# Patient Record
Sex: Female | Born: 1950 | Race: White | Hispanic: No | Marital: Married | Smoking: Light tobacco smoker
Health system: Southern US, Community
[De-identification: ages and names within clinical notes are randomized; demographics above are authoritative.]

## PROBLEM LIST (undated history)

## (undated) DIAGNOSIS — R519 Headache, unspecified: Secondary | ICD-10-CM

## (undated) DIAGNOSIS — R51 Headache: Secondary | ICD-10-CM

## (undated) DIAGNOSIS — K644 Residual hemorrhoidal skin tags: Secondary | ICD-10-CM

## (undated) DIAGNOSIS — K219 Gastro-esophageal reflux disease without esophagitis: Secondary | ICD-10-CM

## (undated) DIAGNOSIS — Q782 Osteopetrosis: Secondary | ICD-10-CM

## (undated) HISTORY — DX: Osteopetrosis: Q78.2

## (undated) HISTORY — PX: COLONOSCOPY: SHX174

## (undated) HISTORY — PX: TUBAL LIGATION: SHX77

---
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
## 1998-07-05 HISTORY — PX: BACK SURGERY: SHX140

## 2014-05-15 ENCOUNTER — Ambulatory Visit: Payer: Self-pay | Admitting: Nurse Practitioner

## 2015-06-16 ENCOUNTER — Telehealth: Payer: Self-pay

## 2015-06-16 ENCOUNTER — Other Ambulatory Visit: Payer: Self-pay

## 2015-06-16 NOTE — Telephone Encounter (Signed)
Gastroenterology Pre-Procedure Review  Request Date:  Requesting Physician: Lilian KapurErin Huprich, NP  PATIENT REVIEW QUESTIONS: The patient responded to the following health history questions as indicated:    1. Are you having any GI issues? no 2. Do you have a personal history of Polyps? yes (5 years ago - polyps - adenoma) 3. Do you have a family history of Colon Cancer or Polyps? yes (father, colon cancer) 4. Diabetes Mellitus? no 5. Joint replacements in the past 12 months?no 6. Major health problems in the past 3 months?no 7. Any artificial heart valves, MVP, or defibrillator?no    MEDICATIONS & ALLERGIES:    Patient reports the following regarding taking any anticoagulation/antiplatelet therapy:   Plavix, Coumadin, Eliquis, Xarelto, Lovenox, Pradaxa, Brilinta, or Effient? no Aspirin? no  Patient confirms/reports the following medications:  No current outpatient prescriptions on file.   No current facility-administered medications for this visit.    Patient confirms/reports the following allergies:  Allergies not on file  No orders of the defined types were placed in this encounter.    AUTHORIZATION INFORMATION Primary Insurance: 1D#: Group #:  Secondary Insurance: 1D#: Group #:  SCHEDULE INFORMATION: Date: 07/11/15 Time: Location: [facility]

## 2015-07-03 ENCOUNTER — Encounter: Payer: Self-pay | Admitting: *Deleted

## 2015-07-10 NOTE — Discharge Instructions (Signed)

## 2015-07-11 ENCOUNTER — Encounter
Admission: RE | Disposition: A | Discharge status: Home / Self Care | Payer: Self-pay | Source: Ambulatory Visit | Attending: Gastroenterology

## 2015-07-11 ENCOUNTER — Ambulatory Visit: Payer: 59 | Admitting: Anesthesiology

## 2015-07-11 ENCOUNTER — Ambulatory Visit
Admission: RE | Admit: 2015-07-11 | Discharge: 2015-07-11 | Disposition: A | Discharge status: Home / Self Care | Payer: 59 | Source: Ambulatory Visit | Attending: Gastroenterology | Admitting: Gastroenterology

## 2015-07-11 ENCOUNTER — Encounter: Payer: Self-pay | Admitting: *Deleted

## 2015-07-11 DIAGNOSIS — K219 Gastro-esophageal reflux disease without esophagitis: Secondary | ICD-10-CM | POA: Insufficient documentation

## 2015-07-11 DIAGNOSIS — F17210 Nicotine dependence, cigarettes, uncomplicated: Secondary | ICD-10-CM | POA: Diagnosis not present

## 2015-07-11 DIAGNOSIS — Z860100 Personal history of colon polyps, unspecified: Secondary | ICD-10-CM | POA: Insufficient documentation

## 2015-07-11 DIAGNOSIS — R12 Heartburn: Secondary | ICD-10-CM | POA: Insufficient documentation

## 2015-07-11 DIAGNOSIS — K641 Second degree hemorrhoids: Secondary | ICD-10-CM | POA: Diagnosis not present

## 2015-07-11 HISTORY — DX: Residual hemorrhoidal skin tags: K64.4

## 2015-07-11 HISTORY — PX: COLONOSCOPY WITH PROPOFOL: SHX5780

## 2015-07-11 HISTORY — PX: ESOPHAGOGASTRODUODENOSCOPY (EGD) WITH PROPOFOL: SHX5813

## 2015-07-11 HISTORY — DX: Headache, unspecified: R51.9

## 2015-07-11 HISTORY — DX: Headache: R51

## 2015-07-11 HISTORY — DX: Gastro-esophageal reflux disease without esophagitis: K21.9

## 2015-07-11 SURGERY — COLONOSCOPY WITH PROPOFOL
Anesthesia: Choice

## 2015-07-11 SURGERY — COLONOSCOPY WITH PROPOFOL
Anesthesia: Monitor Anesthesia Care | Wound class: Contaminated

## 2015-07-11 MED ORDER — LIDOCAINE HCL (CARDIAC) 20 MG/ML IV SOLN
INTRAVENOUS | Status: DC | PRN
  Administered 2015-07-11: 50 mg via INTRAVENOUS

## 2015-07-11 MED ORDER — STERILE WATER FOR IRRIGATION IR SOLN
Status: DC | PRN
  Administered 2015-07-11: 09:00:00

## 2015-07-11 MED ORDER — LACTATED RINGERS IV SOLN
INTRAVENOUS | Status: DC
  Administered 2015-07-11: 08:00:00 via INTRAVENOUS

## 2015-07-11 MED ORDER — GLYCOPYRROLATE 0.2 MG/ML IJ SOLN
INTRAMUSCULAR | Status: DC | PRN
  Administered 2015-07-11: 0.1 mg via INTRAVENOUS

## 2015-07-11 MED ORDER — LACTATED RINGERS IV SOLN: 500.0000 mL | INTRAVENOUS | Status: DC

## 2015-07-11 MED ORDER — PROPOFOL 10 MG/ML IV BOLUS
INTRAVENOUS | Status: DC | PRN
  Administered 2015-07-11 (×2): 20 mg via INTRAVENOUS
  Administered 2015-07-11: 10 mg via INTRAVENOUS
  Administered 2015-07-11: 70 mg via INTRAVENOUS
  Administered 2015-07-11 (×3): 30 mg via INTRAVENOUS
  Administered 2015-07-11: 20 mg via INTRAVENOUS

## 2015-07-11 SURGICAL SUPPLY — 39 items
BALLN DILATOR 10-12 8 (BALLOONS)
BALLN DILATOR 12-15 8 (BALLOONS)
BALLN DILATOR 15-18 8 (BALLOONS)
BALLN DILATOR CRE 0-12 8 (BALLOONS)
BALLN DILATOR ESOPH 8 10 CRE (MISCELLANEOUS) IMPLANT
BALLOON DILATOR 12-15 8 (BALLOONS) IMPLANT
BALLOON DILATOR 15-18 8 (BALLOONS) IMPLANT
BALLOON DILATOR CRE 0-12 8 (BALLOONS) IMPLANT
BLOCK BITE 60FR ADLT L/F GRN (MISCELLANEOUS) ×2 IMPLANT
CANISTER SUCT 1200ML W/VALVE (MISCELLANEOUS) ×2 IMPLANT
FCP ESCP3.2XJMB 240X2.8X (MISCELLANEOUS)
FORCEPS BIOP RAD 4 LRG CAP 4 (CUTTING FORCEPS) IMPLANT
FORCEPS BIOP RJ4 240 W/NDL (MISCELLANEOUS)
FORCEPS ESCP3.2XJMB 240X2.8X (MISCELLANEOUS) IMPLANT
GOWN CVR UNV OPN BCK APRN NK (MISCELLANEOUS) ×2 IMPLANT
GOWN ISOL THUMB LOOP REG UNIV (MISCELLANEOUS) ×2
HEMOCLIP INSTINCT (CLIP) IMPLANT
INJECTOR VARIJECT VIN23 (MISCELLANEOUS) IMPLANT
KIT CO2 TUBING (TUBING) IMPLANT
KIT DEFENDO VALVE AND CONN (KITS) IMPLANT
KIT ENDO PROCEDURE OLY (KITS) ×2 IMPLANT
LIGATOR MULTIBAND 6SHOOTER MBL (MISCELLANEOUS) IMPLANT
MARKER SPOT ENDO TATTOO 5ML (MISCELLANEOUS) IMPLANT
PAD GROUND ADULT SPLIT (MISCELLANEOUS) IMPLANT
SNARE SHORT THROW 13M SML OVAL (MISCELLANEOUS) IMPLANT
SNARE SHORT THROW 30M LRG OVAL (MISCELLANEOUS) IMPLANT
SPOT EX ENDOSCOPIC TATTOO (MISCELLANEOUS)
SUCTION POLY TRAP 4CHAMBER (MISCELLANEOUS) IMPLANT
SYR INFLATION 60ML (SYRINGE) IMPLANT
TRAP SUCTION POLY (MISCELLANEOUS) IMPLANT
TUBING CONN 6MMX3.1M (TUBING)
TUBING SUCTION CONN 0.25 STRL (TUBING) IMPLANT
UNDERPAD 30X60 958B10 (PK) (MISCELLANEOUS) IMPLANT
VALVE BIOPSY ENDO (VALVE) IMPLANT
VARIJECT INJECTOR VIN23 (MISCELLANEOUS)
WATER AUXILLARY (MISCELLANEOUS) IMPLANT
WATER STERILE IRR 250ML POUR (IV SOLUTION) ×2 IMPLANT
WATER STERILE IRR 500ML POUR (IV SOLUTION) IMPLANT
WIRE CRE 18-20MM 8CM F G (MISCELLANEOUS) IMPLANT

## 2015-07-11 NOTE — Op Note (Signed)
[facility] Gastroenterology Patient Name: Karen SeeJanet Flynn Procedure Date: 07/11/2015 8:20 AM MRN: [id number] Account #: [id number] Date of Birth: 08-03-1950 Admit Type: Outpatient Age: 65 [facility] Gender: Female Note Status: Finalized Procedure:         Colonoscopy Indications:       High risk colon cancer surveillance: Personal history of                     colonic polyps Providers:         Midge Miniumarren Nasiyah Laverdiere, MD Medicines:         Propofol per Anesthesia Complications:     No immediate complications. Procedure:         Pre-Anesthesia Assessment:                    - Prior to the procedure, a History and Physical was                     performed, and patient medications and allergies were                     reviewed. The patient's tolerance of previous anesthesia                     was also reviewed. The risks and benefits of the procedure                     and the sedation options and risks were discussed with the                     patient. All questions were answered, and informed consent                     was obtained. Prior Anticoagulants: The patient has taken                     no previous anticoagulant or antiplatelet agents. ASA                     Grade Assessment: II - A patient with mild systemic                     disease. After reviewing the risks and benefits, the                     patient was deemed in satisfactory condition to undergo                     the procedure.                    After obtaining informed consent, the colonoscope was                     passed under direct vision. Throughout the procedure, the                     patient's blood pressure, pulse, and oxygen saturations                     were monitored continuously. The was introduced through                     the anus and advanced to the the cecum, identified by  appendiceal orifice and ileocecal valve. The colonoscopy      was performed without difficulty. The patient tolerated                     the procedure well. The quality of the bowel preparation                     was excellent. Findings:      The perianal and digital rectal examinations were normal.      Non-bleeding internal hemorrhoids were found during retroflexion. The       hemorrhoids were Grade II (internal hemorrhoids that prolapse but reduce       spontaneously). Impression:        - Non-bleeding internal hemorrhoids.                    - No specimens collected. Recommendation:    - Repeat colonoscopy in 5 years for surveillance. Procedure Code(s): --- Professional ---                    [phone], Colonoscopy, flexible; diagnostic, including                     collection of specimen(s) by brushing or washing, when                     performed (separate procedure) Diagnosis Code(s): --- Professional ---                    Z86.010, Personal history of colonic polyps CPT copyright 2014 American Medical Association. All rights reserved. The codes documented in this report are preliminary and upon coder review may  be revised to meet current compliance requirements. Midge Minium, MD 07/11/2015 8:49:37 AM This report has been signed electronically. Number of Addenda: 0 Note Initiated On: 07/11/2015 8:20 AM Scope Withdrawal Time: 0 hours 6 minutes 32 seconds  Total Procedure Duration: 0 hours 12 minutes 11 seconds       [facility]

## 2015-07-11 NOTE — H&P (Signed)
  [facility]  [address] [place], [place] [address] Phone: [phone] Fax : [phone]  Primary Care Physician:  Lawernce PittsHUPRICH, ERIN E, NP Primary Gastroenterologist:  Dr. Servando SnareWohl  Pre-Procedure History & Physical: HPI:  Karen SeeJanet Flynn is a 65 y.o. female is here for an endoscopy and colonoscopy.   Past Medical History  Diagnosis Date  . Anal skin tag   . Headache     sinus  . GERD (gastroesophageal reflux disease)     rare    Past Surgical History  Procedure Laterality Date  . Back surgery  2000  . Cesarean section  1985  . Colonoscopy      Prior to Admission medications   Medication Sig Start Date End Date Taking? Authorizing Provider  acetaminophen (TYLENOL) 325 MG tablet Take 650 mg by mouth every 6 (six) hours as needed.   Yes Historical Provider, MD  loratadine-pseudoephedrine (CLARITIN-D 12-HOUR) 5-120 MG tablet Take 1 tablet by mouth 2 (two) times daily as needed for allergies.   Yes Historical Provider, MD  nystatin cream (MYCOSTATIN) Apply 1 application topically 2 (two) times daily as needed for dry skin.   Yes Historical Provider, MD    Allergies as of 06/16/2015  . (No Known Allergies)    History reviewed. No pertinent family history.  Social History   Social History  . Marital Status: Married    Spouse Name: N/A  . Number of Children: N/A  . Years of Education: N/A   Occupational History  . Not on file.   Social History Main Topics  . Smoking status: Light Tobacco Smoker -- 0.25 packs/day for 40 years    Types: Cigarettes  . Smokeless tobacco: Not on file  . Alcohol Use: Yes     Comment: 1 glass wine/month  . Drug Use: Not on file  . Sexual Activity: Not on file   Other Topics Concern  . Not on file   Social History Narrative    Review of Systems: See HPI, otherwise negative ROS  Physical Exam: BP 124/90 mmHg  Pulse 79  Temp(Src) 97.9 F (36.6 C)  Resp 16  Ht 5' (1.524 m)  Wt 106 lb (48.081 kg)  BMI 20.70  kg/m2  SpO2 97% General:   Alert,  pleasant and cooperative in NAD Head:  Normocephalic and atraumatic. Neck:  Supple; no masses or thyromegaly. Lungs:  Clear throughout to auscultation.    Heart:  Regular rate and rhythm. Abdomen:  Soft, nontender and nondistended. Normal bowel sounds, without guarding, and without rebound.   Neurologic:  Alert and  oriented x4;  grossly normal neurologically.  Impression/Plan: Karen Flynn is here for an endoscopy and colonoscopy to be performed for gerd and history of polyps.  Risks, benefits, limitations, and alternatives regarding  endoscopy and colonoscopy have been reviewed with the patient.  Questions have been answered.  All parties agreeable.   Darlina Rumpfaren Braden Deloach, MD  07/11/2015, 8:18 AM

## 2015-07-11 NOTE — Anesthesia Postprocedure Evaluation (Signed)
Anesthesia Post Note  Patient: Karen Flynn  Procedure(s) Performed: Procedure(s) (LRB): COLONOSCOPY WITH PROPOFOL (N/A) ESOPHAGOGASTRODUODENOSCOPY (EGD) WITH PROPOFOL (N/A)  Patient location during evaluation: PACU Anesthesia Type: MAC Level of consciousness: awake and alert Pain management: pain level controlled Vital Signs Assessment: post-procedure vital signs reviewed and stable Respiratory status: spontaneous breathing, nonlabored ventilation, respiratory function stable and patient connected to nasal cannula oxygen Cardiovascular status: blood pressure returned to baseline and stable Postop Assessment: no signs of nausea or vomiting Anesthetic complications: no    DANIEL D KOVACS

## 2015-07-11 NOTE — Transfer of Care (Signed)
Immediate Anesthesia Transfer of Care Note  Patient: Karen Flynn  Procedure(s) Performed: Procedure(s): COLONOSCOPY WITH PROPOFOL (N/A) ESOPHAGOGASTRODUODENOSCOPY (EGD) WITH PROPOFOL (N/A)  Patient Location: PACU  Anesthesia Type: MAC  Level of Consciousness: awake, alert  and patient cooperative  Airway and Oxygen Therapy: Patient Spontanous Breathing and Patient connected to supplemental oxygen  Post-op Assessment: Post-op Vital signs reviewed, Patient's Cardiovascular Status Stable, Respiratory Function Stable, Patent Airway and No signs of Nausea or vomiting  Post-op Vital Signs: Reviewed and stable  Complications: No apparent anesthesia complications

## 2015-07-11 NOTE — Anesthesia Procedure Notes (Signed)
Procedure Name: MAC Performed by: Taksh Hjort Pre-anesthesia Checklist: Patient identified, Emergency Drugs available, Suction available, Timeout performed and Patient being monitored Patient Re-evaluated:Patient Re-evaluated prior to inductionOxygen Delivery Method: Nasal cannula Placement Confirmation: positive ETCO2       

## 2015-07-11 NOTE — Anesthesia Preprocedure Evaluation (Signed)
Anesthesia Evaluation  Patient identified by MRN, date of birth, ID band Patient awake    Reviewed: Allergy & Precautions, H&P , NPO status , Patient's Chart, lab work & pertinent test results, reviewed documented beta blocker date and time   Airway Mallampati: II  TM Distance: >3 FB Neck ROM: full    Dental no notable dental hx.    Pulmonary Current Smoker,    Pulmonary exam normal breath sounds clear to auscultation       Cardiovascular Exercise Tolerance: Good negative cardio ROS   Rhythm:regular Rate:Normal     Neuro/Psych negative neurological ROS  negative psych ROS   GI/Hepatic Neg liver ROS, GERD  Medicated and Controlled,  Endo/Other  negative endocrine ROS  Renal/GU negative Renal ROS  negative genitourinary   Musculoskeletal   Abdominal   Peds  Hematology negative hematology ROS (+)   Anesthesia Other Findings   Reproductive/Obstetrics negative OB ROS                             Anesthesia Physical Anesthesia Plan  ASA: II  Anesthesia Plan: MAC   Post-op Pain Management:    Induction:   Airway Management Planned:   Additional Equipment:   Intra-op Plan:   Post-operative Plan:   Informed Consent: I have reviewed the patients History and Physical, chart, labs and discussed the procedure including the risks, benefits and alternatives for the proposed anesthesia with the patient or authorized representative who has indicated his/her understanding and acceptance.     Plan Discussed with: CRNA  Anesthesia Plan Comments:         Anesthesia Quick Evaluation

## 2015-07-11 NOTE — Op Note (Signed)
[facility] Gastroenterology Patient Name: Karen Flynn Procedure Date: 07/11/2015 8:20 AM MRN: [id number] Account #: [id number] Date of Birth: 1951-05-16 Admit Type: Outpatient Age: 65 Room: [facility] OR ROOM 01 Gender: Female Note Status: Finalized Procedure:         Upper GI endoscopy Indications:       Heartburn Providers:         Karen Minium, MD Medicines:         Propofol per Anesthesia Complications:     No immediate complications. Procedure:         Pre-Anesthesia Assessment:                    - Prior to the procedure, a History and Physical was                     performed, and patient medications and allergies were                     reviewed. The patient's tolerance of previous anesthesia                     was also reviewed. The risks and benefits of the procedure                     and the sedation options and risks were discussed with the                     patient. All questions were answered, and informed consent                     was obtained. Prior Anticoagulants: The patient has taken                     no previous anticoagulant or antiplatelet agents. ASA                     Grade Assessment: II - A patient with mild systemic                     disease. After reviewing the risks and benefits, the                     patient was deemed in satisfactory condition to undergo                     the procedure.                    After obtaining informed consent, the endoscope was passed                     under direct vision. Throughout the procedure, the                     patient's blood pressure, pulse, and oxygen saturations                     were monitored continuously. The Olympus GIF-[id number]                     Endoscope (S#. [phone]) was introduced through the mouth,                     and advanced to the second part of duodenum. The upper GI  endoscopy was accomplished without difficulty. The patient        tolerated the procedure well. Findings:      The examined esophagus was normal.      The stomach was normal.      The examined duodenum was normal. Impression:        - Normal esophagus.                    - Normal stomach.                    - Normal examined duodenum.                    - No specimens collected. Recommendation:    - Discharge patient to home. Procedure Code(s): --- Professional ---                    [phone], Esophagogastroduodenoscopy, flexible, transoral;                     diagnostic, including collection of specimen(s) by                     brushing or washing, when performed (separate procedure) Diagnosis Code(s): --- Professional ---                    R12, Heartburn CPT copyright 2014 American Medical Association. All rights reserved. The codes documented in this report are preliminary and upon coder review may  be revised to meet current compliance requirements. Karen Miniumarren Serrina Minogue, MD 07/11/2015 8:35:26 AM This report has been signed electronically. Number of Addenda: 0 Note Initiated On: 07/11/2015 8:20 AM Total Procedure Duration: 0 hours 2 minutes 8 seconds       [facility]

## 2015-07-12 ENCOUNTER — Encounter: Payer: Self-pay | Admitting: Gastroenterology

## 2015-11-11 DIAGNOSIS — K219 Gastro-esophageal reflux disease without esophagitis: Secondary | ICD-10-CM | POA: Insufficient documentation

## 2015-11-11 DIAGNOSIS — G44229 Chronic tension-type headache, not intractable: Secondary | ICD-10-CM | POA: Insufficient documentation

## 2015-11-11 DIAGNOSIS — H9319 Tinnitus, unspecified ear: Secondary | ICD-10-CM | POA: Insufficient documentation

## 2015-11-11 DIAGNOSIS — M8580 Other specified disorders of bone density and structure, unspecified site: Secondary | ICD-10-CM | POA: Insufficient documentation

## 2015-11-11 DIAGNOSIS — M5030 Other cervical disc degeneration, unspecified cervical region: Secondary | ICD-10-CM | POA: Insufficient documentation

## 2016-04-09 DIAGNOSIS — M778 Other enthesopathies, not elsewhere classified: Secondary | ICD-10-CM | POA: Insufficient documentation

## 2016-06-03 ENCOUNTER — Other Ambulatory Visit: Payer: Self-pay | Admitting: Otolaryngology

## 2016-06-03 DIAGNOSIS — H903 Sensorineural hearing loss, bilateral: Secondary | ICD-10-CM

## 2016-06-03 DIAGNOSIS — R519 Headache, unspecified: Secondary | ICD-10-CM

## 2016-06-03 DIAGNOSIS — R51 Headache: Secondary | ICD-10-CM

## 2016-06-03 DIAGNOSIS — H9313 Tinnitus, bilateral: Secondary | ICD-10-CM

## 2016-06-15 ENCOUNTER — Ambulatory Visit
Admission: RE | Admit: 2016-06-15 | Discharge: 2016-06-15 | Disposition: A | Discharge status: Home / Self Care | Payer: 59 | Source: Ambulatory Visit | Attending: Otolaryngology | Admitting: Otolaryngology

## 2016-06-15 DIAGNOSIS — R519 Headache, unspecified: Secondary | ICD-10-CM

## 2016-06-15 DIAGNOSIS — H903 Sensorineural hearing loss, bilateral: Secondary | ICD-10-CM | POA: Insufficient documentation

## 2016-06-15 DIAGNOSIS — R51 Headache: Secondary | ICD-10-CM | POA: Diagnosis present

## 2016-06-15 DIAGNOSIS — M2548 Effusion, other site: Secondary | ICD-10-CM | POA: Diagnosis not present

## 2016-06-15 DIAGNOSIS — H9313 Tinnitus, bilateral: Secondary | ICD-10-CM

## 2016-06-15 MED ORDER — GADOBENATE DIMEGLUMINE 529 MG/ML IV SOLN
10.0000 mL | Freq: Once | INTRAVENOUS | Status: AC | PRN
  Administered 2016-06-15: 9 mL via INTRAVENOUS

## 2016-07-29 ENCOUNTER — Ambulatory Visit: Payer: 59 | Admitting: Dietician

## 2016-07-30 ENCOUNTER — Encounter: Payer: 59 | Attending: Internal Medicine | Admitting: Dietician

## 2016-07-30 VITALS — Ht 60.0 in | Wt 101.0 lb

## 2016-07-30 DIAGNOSIS — Z713 Dietary counseling and surveillance: Secondary | ICD-10-CM | POA: Diagnosis not present

## 2016-07-30 DIAGNOSIS — M810 Age-related osteoporosis without current pathological fracture: Secondary | ICD-10-CM

## 2016-07-30 NOTE — Patient Instructions (Addendum)
-  Continue to eat small, frequent meals throughout the day  -Try to include a protein and calcium/ vitamin D source with each meal or snack  -Include more protein sources throughout the day (can be protein alternatives such as soy, beans, dairy, peanut butter)   -Continue to take calcium supplement  -Include a fiber, healthy fat or protein source with snacks like chocolate to decrease glycemic load

## 2016-07-30 NOTE — Progress Notes (Signed)
Medical Nutrition Therapy: Visit start time: 1330  end time: 1430  Assessment:  Diagnosis: Osteoporosis Past medical history: Osteopenia, Tinnitus, GERD, Degenerative Disk Disease Psychosocial issues/ stress concerns: none Preferred learning method:  . No preference indicated  Current weight: 101.1lbs  Height: 5\' 0"  Medications, supplements: stopped taking all prescription medications and started taking natural supplements 1 week ago- AlgaeCal Plus and Stontium Boost supplements for bone health. Decision was discussed with patient's MD.  Progress and evaluation: Recent lab work revealed elevated Cholesterol (275) and HgbA1c (6.1), also has osteoporosis and GERD, all which she wishes to address and reverse through dietary interventions rather than medications. States she stopped taking the proton pump inhibitor due to belief that the medication was having negative effects on bone density. Takes AlgaeCal Plus supplement 2x/day with a meal+ protein source. Takes Strontium Boost daily at night. States she eats frequently throughout the day as she cannot have large portions of food at one time. Describes early satiety and feeling bloated. Does not have a high protein intake though describes increased effort to include more protein sources due to its effects in improving osteoporosis. Discussed alternative protein sources such as dairy products (eats minimally), nuts, peanut butter, soy, and beans due to her distaste for most meats. Encouraged regular intake of sources of protein, Vitamin D and Calcium, having a source of each at each meal and snack being ideal. Reviewed: dietary sources of Vitamin D and Calcium, foods that trigger GERD symptoms, foods high in fiber, high and low glycemic foods, % of total calories recommended to come from protein/ carbs/ fats daily for the general population, whole grains vs. Processed grains, effects of fiber on cholesterol, healthy fat sources.  Physical activity: Walking  4-5x/wk for 30-5940min each  Dietary Intake:  Usual eating pattern includes 4 small meals and 1-2 snacks per day. Dining out frequency: 1 meals per week.  Breakfast: oatmeal, egg whites, walnuts Snack: pretzels or crackers with cheese Lunch: small portion of meat such as chicken, sides vary Snack: dove chocolate  Supper: varies Snack: nuts or fruit Beverages: water, little coffee, recently cut out diet coke  Nutrition Care Education: Topics covered: management and reduction of osteoporosis, high cholesterol, elevated HgbA1c Basic nutrition: basic food groups, appropriate nutrient balance, appropriate meal and snack schedule, general nutrition guidelines    Weight control: benefits of weight control, identifying healthy weight, determining reasonable weight goal, behavioral changes for weight loss Advanced nutrition:  recipe modification, cooking techniques, dining out, food label reading Diabetes:  goals for BGs, appropriate meal and snack schedule, Carb counting, appropriate carb intake and balance, sick day guidelines Hypertension:  importance of controlling BP, identifying high sodium foods, identifying food sources of Calcium, potassium, magnesium Hyperlipidemia:  target goals for lipids, healthy and unhealthy fats, role of fiber food sources of folate, Vitamin B12, B6, Vitamin E, phytochemicals Other lifestyle changes:  benefits of making changes, increasing motivation, readiness for change, identifying habits that need to change, alcohol use, food and drug interactions  Nutritional Diagnosis:  NI-5.10.1 Inadequate mineral intake (specify): Calcium, Vitamin D As related to low intake of dietary sources of these nutrients.  As evidenced by osteoporosis diagnosis.  Intervention: Discussion as noted above. Nutrition therapy for osteoporosis informational packet given and reviewed with patient, who states she will work on implementing the discussed goals and follow up if need be with our  services after most recent lab work returns.  Education Materials given:  . General diet guidelines for Cholesterol-lowering/ Heart health . Sample meal  pattern/ menus . Nutrition therapy for osteoporosis- [employer] NCM  Learner/ who was taught:  . Patient   Level of understanding: Marland Kitchen Verbalizes/ demonstrates competency  Demonstrated degree of understanding via:   Teach back Learning barriers: . None  Willingness to learn/ readiness for change: . Eager, change in progress  Monitoring and Evaluation:  Dietary intake, exercise, relevant lab work      follow up: not at this time

## 2017-01-24 DIAGNOSIS — M778 Other enthesopathies, not elsewhere classified: Secondary | ICD-10-CM | POA: Insufficient documentation

## 2017-01-27 ENCOUNTER — Encounter: Payer: Self-pay | Admitting: Advanced Practice Midwife

## 2017-01-27 ENCOUNTER — Ambulatory Visit (INDEPENDENT_AMBULATORY_CARE_PROVIDER_SITE_OTHER): Payer: 59 | Admitting: Advanced Practice Midwife

## 2017-01-27 VITALS — BP 124/74 | Ht 60.0 in | Wt 104.0 lb

## 2017-01-27 DIAGNOSIS — Z0000 Encounter for general adult medical examination without abnormal findings: Secondary | ICD-10-CM

## 2017-01-27 DIAGNOSIS — Z01419 Encounter for gynecological examination (general) (routine) without abnormal findings: Secondary | ICD-10-CM

## 2017-01-27 NOTE — Progress Notes (Signed)
Patient ID: Karen Flynn, female   DOB: 07-06-1950, 66 y.o.   MRN: [id number]     Gynecology Annual Exam  PCP: Marguarite ArbourSparks, Jeffrey D, MD  Chief Complaint:  Chief Complaint  Patient presents with  . Annual Exam    History of Present Illness:Patient is a 66 y.o. G1P1001 presents for annual exam. The patient has no complaints today. We had a lengthy discussion of smoking cessation. She quit her 42 year <1/2PPD habit about 3 months ago and then resumed a couple of weeks ago. She also has questions about various screenings and ages at which to continue or discontinue screenings. She has a primary care doctor who generally arranges her medical screenings.   LMP: No LMP recorded. Patient is postmenopausal.  Postcoital Bleeding: not applicable    The patient is not sexually active. She denies dyspareunia.  The patient does occasionally perform self breast exams.  There is no notable family history of breast or ovarian cancer in her family.  The patient wears seatbelts: yes.   The patient has regular exercise: yes.  She admits to healthy diet and adequate hydration. She is taking Algae Cal which is a calcium, vitamin D supplement and Strontium Boost for bone strength.   The patient denies current symptoms of depression.  She admits to not sleeping well  Review of Systems: Review of Systems  Constitutional: Negative.   HENT: Negative.   Eyes: Negative.   Respiratory: Negative.   Cardiovascular: Negative.   Gastrointestinal: Negative.   Genitourinary: Negative.   Musculoskeletal: Negative.   Skin: Negative.   Neurological: Negative.   Endo/Heme/Allergies: Negative.   Psychiatric/Behavioral: Negative.     Past Medical History:  Past Medical History:  Diagnosis Date  . Anal skin tag   . GERD (gastroesophageal reflux disease)    rare  . Headache    sinus  . Osteopetrosis     Past Surgical History:  Past Surgical History:  Procedure Laterality Date  . BACK SURGERY  2000  .  CESAREAN SECTION  1985  . COLONOSCOPY    . COLONOSCOPY WITH PROPOFOL N/A 07/11/2015   Procedure: COLONOSCOPY WITH PROPOFOL;  Surgeon: Midge Miniumarren Wohl, MD;  [facility];  Service: Endoscopy;  Laterality: N/A;  . ESOPHAGOGASTRODUODENOSCOPY (EGD) WITH PROPOFOL N/A 07/11/2015   Procedure: ESOPHAGOGASTRODUODENOSCOPY (EGD) WITH PROPOFOL;  Surgeon: Midge Miniumarren Wohl, MD;  [facility];  Service: Endoscopy;  Laterality: N/A;    Gynecologic History:  No LMP recorded. Patient is postmenopausal. Last Pap: Results were: 3 years ago no abnormalities  Last mammogram: 18 months ago Results were: BI-RAD I Obstetric History: G1P1001  Family History:  Family History  Problem Relation Age of Onset  . Uterine cancer Mother   . Colon cancer Father     Social History:  Social History   Social History  . Marital status: Married    Spouse name: N/A  . Number of children: N/A  . Years of education: N/A   Occupational History  . Not on file.   Social History Main Topics  . Smoking status: Light Tobacco Smoker    Packs/day: 0.25    Years: 40.00    Types: Cigarettes  . Smokeless tobacco: Not on file  . Alcohol use Yes     Comment: 1 glass wine/month  . Drug use: Unknown  . Sexual activity: Not Currently    Birth control/ protection: Post-menopausal   Other Topics Concern  . Not on file   Social History Narrative  . No  narrative on file    Allergies:  No Known Allergies  Medications: Prior to Admission medications   Medication Sig Start Date End Date Taking? Authorizing Provider  Multiple Vitamin (MULTI-VITAMINS) TABS Take by mouth.   Yes [provider]  tretinoin (RETIN-A) 0.05 % cream Apply topically. 02/05/16 02/04/17 Yes [provider]  acetaminophen (TYLENOL) 325 MG tablet Take 650 mg by mouth every 6 (six) hours as needed.    [provider]  fluticasone (FLONASE) 50 MCG/ACT nasal spray Place into the nose.    [provider]  Loratadine 10 MG CAPS Take by mouth.    [provider]  loratadine-pseudoephedrine (CLARITIN-D 12-HOUR) 5-120 MG tablet Take 1 tablet by mouth 2 (two) times daily as needed for allergies.    [provider]  nystatin cream (MYCOSTATIN) Apply 1 application topically 2 (two) times daily as needed for dry skin.    [provider]    Physical Exam Vitals: Blood pressure 124/74, height 5' (1.524 m), weight 104 lb (47.2 kg).  General: NAD HEENT: normocephalic, anicteric Thyroid: no enlargement, no palpable nodules Pulmonary: No increased work of breathing, CTAB Cardiovascular: RRR, distal pulses 2+ Breast: Breast symmetrical, no tenderness, no palpable nodules or masses, no skin or nipple retraction present, no nipple discharge.  No axillary or supraclavicular lymphadenopathy. Abdomen: NABS, soft, non-tender, non-distended.  Umbilicus without lesions.  No hepatomegaly, splenomegaly or masses palpable. No evidence of hernia  Genitourinary:  External: Normal external female genitalia.  Normal urethral meatus, normal  Bartholin's and Skene's glands.    Vagina: Normal vaginal mucosa, no evidence of prolapse.    Cervix: Unable to tolerate speculum, no visualization of cervix, no PAP  Uterus: deferred for no gyn concerns    Adnexa: deferred for no gyn concerns  Rectal: deferred  Lymphatic: no evidence of inguinal lymphadenopathy Extremities: no edema, erythema, or tenderness Neurologic: Grossly intact Psychiatric: mood appropriate, affect full   Assessment: 66 y.o. G1P1001 Well woman exam  Plan: Problem List Items Addressed This Visit    None    Visit Diagnoses    Well woman exam without gynecological exam    -  Primary      1) Mammogram - recommend yearly screening mammogram.  Mammogram ordered today  2) Pelvic floor exercises  3) ASCCP guidelines and rational discussed.  Patient opts for every 3-5 years as tolerated screening interval  4)  Osteoporosis  - per USPTF routine screening DEXA at age 64 Continue current plan with PCP  5) Routine healthcare maintenance including cholesterol, diabetes screening discussed managed by PCP  6) Colonoscopy managed by PCP.  Screening recommended starting at age 64 for average risk individuals, age 64 for individuals deemed at increased risk (including African Americans) and recommended to continue until age 875.  For patient age 64-85 individualized approach is recommended.  Gold standard screening is via colonoscopy, Cologuard screening is an acceptable alternative for patient unwilling or unable to undergo colonoscopy.  "Colorectal cancer screening for average?risk adults: 2018 guideline update from the American Cancer Society"CA: A Cancer Journal for Clinicians: Dec 01, 2016   7) Follow up 1 year for routine annual   Tresea MallJane Ieisha Gao, [place]

## 2017-05-17 ENCOUNTER — Other Ambulatory Visit: Payer: Self-pay | Admitting: Internal Medicine

## 2017-05-17 DIAGNOSIS — R3121 Asymptomatic microscopic hematuria: Secondary | ICD-10-CM

## 2017-05-20 ENCOUNTER — Ambulatory Visit
Admission: RE | Admit: 2017-05-20 | Discharge: 2017-05-20 | Disposition: A | Discharge status: Home / Self Care | Payer: 59 | Source: Ambulatory Visit | Attending: Internal Medicine | Admitting: Internal Medicine

## 2017-05-20 DIAGNOSIS — R3121 Asymptomatic microscopic hematuria: Secondary | ICD-10-CM | POA: Diagnosis present

## 2017-05-23 ENCOUNTER — Encounter: Payer: Self-pay | Admitting: *Deleted

## 2017-06-01 ENCOUNTER — Ambulatory Visit: Payer: 59 | Admitting: Anesthesiology

## 2017-06-01 ENCOUNTER — Ambulatory Visit
Admission: RE | Admit: 2017-06-01 | Discharge: 2017-06-01 | Disposition: A | Discharge status: Home / Self Care | Payer: 59 | Source: Ambulatory Visit | Attending: Surgery | Admitting: Surgery

## 2017-06-01 ENCOUNTER — Encounter
Admission: RE | Disposition: A | Discharge status: Home / Self Care | Payer: Self-pay | Source: Ambulatory Visit | Attending: Surgery

## 2017-06-01 DIAGNOSIS — K219 Gastro-esophageal reflux disease without esophagitis: Secondary | ICD-10-CM | POA: Diagnosis not present

## 2017-06-01 DIAGNOSIS — G5601 Carpal tunnel syndrome, right upper limb: Secondary | ICD-10-CM | POA: Diagnosis not present

## 2017-06-01 DIAGNOSIS — G5621 Lesion of ulnar nerve, right upper limb: Secondary | ICD-10-CM | POA: Diagnosis not present

## 2017-06-01 DIAGNOSIS — F17200 Nicotine dependence, unspecified, uncomplicated: Secondary | ICD-10-CM | POA: Diagnosis not present

## 2017-06-01 HISTORY — PX: CARPAL TUNNEL RELEASE: SHX101

## 2017-06-01 SURGERY — RELEASE, CARPAL TUNNEL, ENDOSCOPIC
Anesthesia: Regional | Laterality: Right | Wound class: Clean

## 2017-06-01 MED ORDER — HYDROCODONE-ACETAMINOPHEN 7.5-325 MG PO TABS: 1.0000 | ORAL_TABLET | ORAL | Status: DC | PRN

## 2017-06-01 MED ORDER — ACETAMINOPHEN 160 MG/5ML PO SOLN: 325.0000 mg | ORAL | Status: DC | PRN

## 2017-06-01 MED ORDER — FENTANYL CITRATE (PF) 100 MCG/2ML IJ SOLN: 25.0000 ug | INTRAMUSCULAR | Status: DC | PRN

## 2017-06-01 MED ORDER — HYDROCODONE-ACETAMINOPHEN 7.5-325 MG PO TABS: 1.0000 | ORAL_TABLET | Freq: Four times a day (QID) | ORAL | 0 refills | Status: DC | PRN

## 2017-06-01 MED ORDER — LIDOCAINE HCL (CARDIAC) 20 MG/ML IV SOLN
INTRAVENOUS | Status: DC | PRN
  Administered 2017-06-01: 50 mg via INTRAVENOUS

## 2017-06-01 MED ORDER — LACTATED RINGERS IV SOLN
10.0000 mL/h | INTRAVENOUS | Status: DC
  Administered 2017-06-01: 10 mL/h via INTRAVENOUS

## 2017-06-01 MED ORDER — OXYCODONE HCL 5 MG PO TABS: 5.0000 mg | ORAL_TABLET | Freq: Once | ORAL | Status: DC | PRN

## 2017-06-01 MED ORDER — ONDANSETRON HCL 4 MG/2ML IJ SOLN: 4.0000 mg | Freq: Four times a day (QID) | INTRAMUSCULAR | Status: DC | PRN

## 2017-06-01 MED ORDER — ONDANSETRON HCL 4 MG PO TABS: 4.0000 mg | ORAL_TABLET | Freq: Four times a day (QID) | ORAL | Status: DC | PRN

## 2017-06-01 MED ORDER — PROMETHAZINE HCL 25 MG/ML IJ SOLN: 6.2500 mg | INTRAMUSCULAR | Status: DC | PRN

## 2017-06-01 MED ORDER — MIDAZOLAM HCL 5 MG/5ML IJ SOLN
INTRAMUSCULAR | Status: DC | PRN
  Administered 2017-06-01: 2 mg via INTRAVENOUS

## 2017-06-01 MED ORDER — OXYCODONE HCL 5 MG/5ML PO SOLN: 5.0000 mg | Freq: Once | ORAL | Status: DC | PRN

## 2017-06-01 MED ORDER — DEXTROSE 5 % IV SOLN
2000.0000 mg | Freq: Once | INTRAVENOUS | Status: AC
  Administered 2017-06-01: 2000 mg via INTRAVENOUS

## 2017-06-01 MED ORDER — BUPIVACAINE HCL (PF) 0.5 % IJ SOLN
INTRAMUSCULAR | Status: DC | PRN
  Administered 2017-06-01: 20 mL

## 2017-06-01 MED ORDER — METOCLOPRAMIDE HCL 5 MG PO TABS: 5.0000 mg | ORAL_TABLET | Freq: Three times a day (TID) | ORAL | Status: DC | PRN

## 2017-06-01 MED ORDER — PROPOFOL 500 MG/50ML IV EMUL
INTRAVENOUS | Status: DC | PRN
  Administered 2017-06-01: 25 ug/kg/min via INTRAVENOUS

## 2017-06-01 MED ORDER — FENTANYL CITRATE (PF) 100 MCG/2ML IJ SOLN
INTRAMUSCULAR | Status: DC | PRN
  Administered 2017-06-01: 50 ug via INTRAVENOUS

## 2017-06-01 MED ORDER — METOCLOPRAMIDE HCL 5 MG/ML IJ SOLN: 5.0000 mg | Freq: Three times a day (TID) | INTRAMUSCULAR | Status: DC | PRN

## 2017-06-01 MED ORDER — ROPIVACAINE HCL 5 MG/ML IJ SOLN
INTRAMUSCULAR | Status: DC | PRN
  Administered 2017-06-01: 30 mL via EPIDURAL

## 2017-06-01 MED ORDER — POTASSIUM CHLORIDE IN NACL 20-0.9 MEQ/L-% IV SOLN: INTRAVENOUS | Status: DC

## 2017-06-01 MED ORDER — ACETAMINOPHEN 325 MG PO TABS: 325.0000 mg | ORAL_TABLET | ORAL | Status: DC | PRN

## 2017-06-01 SURGICAL SUPPLY — 30 items
BANDAGE ELASTIC 2 LF NS (GAUZE/BANDAGES/DRESSINGS) ×2 IMPLANT
BENZOIN TINCTURE PRP APPL 2/3 (GAUZE/BANDAGES/DRESSINGS) ×2 IMPLANT
BNDG COHESIVE 4X5 TAN STRL (GAUZE/BANDAGES/DRESSINGS) ×2 IMPLANT
BNDG ESMARK 4X12 TAN STRL LF (GAUZE/BANDAGES/DRESSINGS) ×2 IMPLANT
CHLORAPREP W/TINT 26ML (MISCELLANEOUS) ×2 IMPLANT
CORD BIP STRL DISP 12FT (MISCELLANEOUS) ×2 IMPLANT
COVER LIGHT HANDLE UNIVERSAL (MISCELLANEOUS) ×4 IMPLANT
CUFF TOURN SGL QUICK 18 (TOURNIQUET CUFF) ×2 IMPLANT
CUFF TOURNIQUET DUAL PORT 18X3 (MISCELLANEOUS) IMPLANT
DRAPE SURG 17X11 SM STRL (DRAPES) ×2 IMPLANT
GAUZE PETRO XEROFOAM 1X8 (MISCELLANEOUS) ×2 IMPLANT
GAUZE SPONGE 4X4 12PLY STRL (GAUZE/BANDAGES/DRESSINGS) ×2 IMPLANT
GLOVE BIO SURGEON STRL SZ8 (GLOVE) ×2 IMPLANT
GLOVE INDICATOR 8.0 STRL GRN (GLOVE) ×2 IMPLANT
GOWN STRL REUS W/ TWL LRG LVL3 (GOWN DISPOSABLE) ×1 IMPLANT
GOWN STRL REUS W/ TWL XL LVL3 (GOWN DISPOSABLE) ×1 IMPLANT
GOWN STRL REUS W/TWL LRG LVL3 (GOWN DISPOSABLE) ×1
GOWN STRL REUS W/TWL XL LVL3 (GOWN DISPOSABLE) ×1
KIT CARPAL TUNNEL (MISCELLANEOUS) ×1
KIT ESCP INSRT D SLOT CANN KN (MISCELLANEOUS) ×1 IMPLANT
KIT ROOM TURNOVER OR (KITS) ×2 IMPLANT
NS IRRIG 500ML POUR BTL (IV SOLUTION) ×2 IMPLANT
PACK EXTREMITY ARMC (MISCELLANEOUS) ×2 IMPLANT
SPLINT WRIST M RT TX990303 (SOFTGOODS) ×2 IMPLANT
STOCKINETTE IMPERVIOUS 9X36 MD (GAUZE/BANDAGES/DRESSINGS) ×2 IMPLANT
STRAP BODY AND KNEE 60X3 (MISCELLANEOUS) ×2 IMPLANT
STRIP CLOSURE SKIN 1/4X4 (GAUZE/BANDAGES/DRESSINGS) ×2 IMPLANT
SUT PROLENE 4 0 PS 2 18 (SUTURE) ×2 IMPLANT
SUT VIC AB 3-0 SH 27 (SUTURE) ×1
SUT VIC AB 3-0 SH 27X BRD (SUTURE) ×1 IMPLANT

## 2017-06-01 NOTE — Discharge Instructions (Signed)
General Anesthesia, Adult, Care After These instructions provide you with information about caring for yourself after your procedure. Your health care provider may also give you more specific instructions. Your treatment has been planned according to current medical practices, but problems sometimes occur. Call your health care provider if you have any problems or questions after your procedure. What can I expect after the procedure? After the procedure, it is common to have:  Vomiting.  A sore throat.  Mental slowness.  It is common to feel:  Nauseous.  Cold or shivery.  Sleepy.  Tired.  Sore or achy, even in parts of your body where you did not have surgery.  Follow these instructions at home: For at least 24 hours after the procedure:  Do not: ? Participate in activities where you could fall or become injured. ? Drive. ? Use heavy machinery. ? Drink alcohol. ? Take sleeping pills or medicines that cause drowsiness. ? Make important decisions or sign legal documents. ? Take care of children on your own.  Rest. Eating and drinking  If you vomit, drink water, juice, or soup when you can drink without vomiting.  Drink enough fluid to keep your urine clear or pale yellow.  Make sure you have little or no nausea before eating solid foods.  Follow the diet recommended by your health care provider. General instructions  Have a responsible adult stay with you until you are awake and alert.  Return to your normal activities as told by your health care provider. Ask your health care provider what activities are safe for you.  Take over-the-counter and prescription medicines only as told by your health care provider.  If you smoke, do not smoke without supervision.  Keep all follow-up visits as told by your health care provider. This is important. Contact a health care provider if:  You continue to have nausea or vomiting at home, and medicines are not helpful.  You  cannot drink fluids or start eating again.  You cannot urinate after 8-12 hours.  You develop a skin rash.  You have fever.  You have increasing redness at the site of your procedure. Get help right away if:  You have difficulty breathing.  You have chest pain.  You have unexpected bleeding.  You feel that you are having a life-threatening or urgent problem. This information is not intended to replace advice given to you by your health care provider. Make sure you discuss any questions you have with your health care provider. Document Released: 09/27/2000 Document Revised: 11/24/2015 Document Reviewed: 06/05/2015 Elsevier Interactive Patient Education  2018 [employer]   Keep dressing dry and intact. Keep hand elevated above heart level. May shower after dressing removed on postop day 4 (Sunday). Cover sutures with Band-Aids after drying off, then reapply Velcro wrist splint. Use sling as necessary for comfort. Apply ice to affected areas frequently. Take ibuprofen 600 mg TID with meals for 7-10 days, then as necessary. Take pain medication as prescribed or ES Tylenol when needed.  Return for follow-up in 10-14 days or as scheduled.

## 2017-06-01 NOTE — Anesthesia Procedure Notes (Signed)
Performed by: Felton Buczynski, CRNA Pre-anesthesia Checklist: Patient identified, Emergency Drugs available, Suction available, Timeout performed and Patient being monitored Patient Re-evaluated:Patient Re-evaluated prior to induction Oxygen Delivery Method: Nasal cannula Placement Confirmation: positive ETCO2       

## 2017-06-01 NOTE — Progress Notes (Signed)
Assisted Dr Gardiner FantiScouras with right, ultrasound guided, interscalene  block. Side rails up, monitors on throughout procedure. See vital signs in flow sheet. Tolerated Procedure well.

## 2017-06-01 NOTE — Transfer of Care (Signed)
Immediate Anesthesia Transfer of Care Note  Patient: Karen Flynn  Procedure(s) Performed: CARPAL TUNNEL RELEASE ENDOSCOPIC WITH SUBCUTANEWOUS TRANSOSITION OF THE ULNAR NERVE AT ELBOW (Right )  Patient Location: PACU  Anesthesia Type: General, Regional  Level of Consciousness: awake, alert  and patient cooperative  Airway and Oxygen Therapy: Patient Spontanous Breathing and Patient connected to supplemental oxygen  Post-op Assessment: Post-op Vital signs reviewed, Patient's Cardiovascular Status Stable, Respiratory Function Stable, Patent Airway and No signs of Nausea or vomiting  Post-op Vital Signs: Reviewed and stable  Complications: No apparent anesthesia complications

## 2017-06-01 NOTE — H&P (Signed)
Paper H&P to be scanned into permanent record. H&P reviewed and patient re-examined. No changes. 

## 2017-06-01 NOTE — Anesthesia Procedure Notes (Signed)
Anesthesia Regional Block: Supraclavicular block   Pre-Anesthetic Checklist: ,, timeout performed, Correct Patient, Correct Site, Correct Laterality, Correct Procedure, Correct Position, site marked, Risks and benefits discussed,  Surgical consent,  Pre-op evaluation,  At surgeon's request and post-op pain management  Laterality: Right  Prep: chloraprep       Needles:  Injection technique: Single-shot  Needle Type: Stimiplex     Needle Length: 2cm  Needle Gauge: 22     Additional Needles:   Procedures:,,,, ultrasound used (permanent image in chart),,,,  Narrative:  Start time: 06/01/2017 11:34 AM End time: 06/01/2017 11:41 AM Injection made incrementally with aspirations every 5 mL.  Performed by: Personally  Anesthesiologist: Aldine ContesScouras, Nicole Elaine, MD

## 2017-06-01 NOTE — Addendum Note (Signed)
Addendum  created 06/01/17 1550 by Aldine ContesScouras, Emmalise Huard Elaine, MD   Intraprocedure Meds edited

## 2017-06-01 NOTE — Anesthesia Postprocedure Evaluation (Signed)
Anesthesia Post Note  Patient: Karen Flynn  Procedure(s) Performed: CARPAL TUNNEL RELEASE ENDOSCOPIC WITH SUBCUTANEWOUS TRANSOSITION OF THE ULNAR NERVE AT ELBOW (Right )  Patient location during evaluation: PACU Anesthesia Type: Regional Level of consciousness: awake and alert Pain management: pain level controlled Vital Signs Assessment: post-procedure vital signs reviewed and stable Respiratory status: spontaneous breathing, nonlabored ventilation, respiratory function stable and patient connected to nasal cannula oxygen Cardiovascular status: blood pressure returned to baseline and stable Postop Assessment: no apparent nausea or vomiting Anesthetic complications: no    Marisha Renier ELAINE

## 2017-06-01 NOTE — Anesthesia Preprocedure Evaluation (Signed)
Anesthesia Evaluation  Patient identified by MRN, date of birth, ID band Patient awake    Reviewed: Allergy & Precautions, H&P , NPO status , Patient's Chart, lab work & pertinent test results, reviewed documented beta blocker date and time   Airway Mallampati: II  TM Distance: >3 FB Neck ROM: full    Dental no notable dental hx.    Pulmonary Current Smoker,    Pulmonary exam normal breath sounds clear to auscultation       Cardiovascular Exercise Tolerance: Good negative cardio ROS   Rhythm:regular Rate:Normal     Neuro/Psych negative neurological ROS  negative psych ROS   GI/Hepatic Neg liver ROS, GERD  Medicated and Controlled,  Endo/Other  negative endocrine ROS  Renal/GU negative Renal ROS  negative genitourinary   Musculoskeletal   Abdominal   Peds  Hematology negative hematology ROS (+)   Anesthesia Other Findings   Reproductive/Obstetrics negative OB ROS                             Anesthesia Physical  Anesthesia Plan  ASA: II  Anesthesia Plan: General and Regional   Post-op Pain Management:  Regional for Post-op pain   Induction:   PONV Risk Score and Plan:   Airway Management Planned:   Additional Equipment:   Intra-op Plan:   Post-operative Plan:   Informed Consent: I have reviewed the patients History and Physical, chart, labs and discussed the procedure including the risks, benefits and alternatives for the proposed anesthesia with the patient or authorized representative who has indicated his/her understanding and acceptance.   Dental Advisory Given  Plan Discussed with: CRNA  Anesthesia Plan Comments:         Anesthesia Quick Evaluation

## 2017-06-01 NOTE — Op Note (Signed)
06/01/2017  1:54 PM  Patient:   Reeves ForthJanet L Aponte  Pre-Op Diagnosis:   1. Right cubital tunnel syndrome.  2. Right carpal tunnel syndrome.  Post-Op Diagnosis:   Same  Procedure:   1. Subcutaneous anterior transposition ulnar nerve, right elbow.  2. Endoscopic right carpal tunnel release.  Surgeon:   Maryagnes AmosJ. Jeffrey Sherif Millspaugh, MD  Assistant:   Marga HootsNicole Stephens, PA-S  Anesthesia:   IV sedation with an interscalene block placed by anesthesia  Findings:   As above.  Complications:   None  EBL:   0 cc  Fluids:   800 cc crystalloid  TT:   48 minutes at 250 mmHg  Drains:   None  Closure:   Staples  Brief Clinical Note:   The patient is a 66 year old female with history of right hand and wrist pain and paresthesias. The patient's history and examination were consistent with both cubital tunnel syndrome and carpal tunnel syndrome, confirmed by an EMG. The patient presents at this time for a subcutaneous anterior transposition of the ulnar nerve at the right elbow, as well as an endoscopic right carpal tunnel release.  Procedure:   The patient underwent the placement of an interscalene block in the preoperative holding area by the anesthesiologist before she was brought into the operating room and lain in the supine position. After adequate IV sedation was obtained, the patient's right upper extremity was prepped with ChloraPrep solution before being draped sterilely. Preoperative antibiotics were administered. After performing a timeout to verify the appropriate surgical site, the limb was exsanguinated with an Esmarch and a sterile tourniquet inflated to 250 mmHg.   An approximately 1.5-2 cm incision was made over the volar wrist flexion crease, centered over the palmaris longus tendon. The incision was carried down through the subcutaneous tissues with care taken to identify and protect any neurovascular structures. The distal forearm fascia was penetrated just proximal to the transverse carpal  ligament. The soft tissues were released off the superficial and deep surfaces of the distal forearm fascia and this was released proximally for 3-4 cm under direct visualization.  Attention was directed distally. The Therapist, nutritionalreer elevator was passed beneath the transverse carpal ligament along the ulnar aspect of the carpal tunnel and used to release any adhesions as well as to remove any adherent synovial tissue before first the smaller then the larger of the two dilators were passed beneath the transverse carpal ligament along the ulnar margin of the carpal tunnel. The slotted cannula was introduced and the endoscope was placed into the slotted cannula and the undersurface of the transverse carpal ligament visualized. The distal margin of the transverse carpal ligament was marked by placing a 25-gauge needle percutaneously at Kaplan's cardinal point so that it entered the distal portion of the slotted cannula. Under endoscopic visualization, the transverse carpal ligament was released from proximal to distal using the end-cutting blade. A second pass was performed to ensure complete release of the ligament. The adequacy of release was verified both endoscopically and by palpation using the freer elevator. The wound was irrigated thoroughly with sterile saline solution before being closed using 4-0 Prolene interrupted sutures.  Attention was then directed to the ulnar nerve.  An approximately 7-8 cm curvilinear incision was made along the course of the ulnar nerve posterior to the medial epicondyle. The incision was carried down through the subcutaneous tissues with care taken to avoid the small branches of the medial antebrachial nerve to expose the sheath overlying the cubital tunnel. The ulnar nerve  was identified at the proximal end of the tunnel and was dissected free. The nerve was then carefully followed as the roof of the cubital tunnel was released from proximal to distal. Distally, the fascia overlying the  pronator muscle was released for several centimeters. The nerve was clearly thickened just proximal to the pronator fascia, indicating the most likely source of the nerve compression. A vessel loop was passed around the nerve and used to provide gentle traction on the nerve while circumferential dissection was carried out under loupe magnification using bipolar electrocautery and tenotomy scissors.   Once the nerve was fully mobilized, the anterior tissues were elevated as a flap just superficial to the fascia overlying the flexor wad and a pocket created to accept the nerve. Care was taken to be sure that there was no undue tension along the nerve either proximally or distally. The nerve was carefully retracted while several 2-0 Vicryl interrupted sutures were placed to reapproximate the flap to the medial epicondylar soft tissues, thereby creating a "sling" for the ulnar nerve. The cubital tunnel itself was reapproximated using several 2-0 Vicryl interrupted sutures in order to prevent the nerve from falling back into the cubital tunnel.   The wound was copiously irrigated with sterile saline solution before the subcutaneous tissues were closed using 3-0 Vicryl interrupted sutures. The skin was closed using benzoin and Steri-Strips. A total of 20 cc of 0.5% plain Sensorcaine was injected in and around the two incisions to help with postoperative analgesia. Sterile bulky dressings were applied to both wounds before the patient was placed into a Velcro volar wrist splint and a sling. The patient was then awakened and returned to the recovery room in satisfactory condition after tolerating the procedure well.

## 2017-06-03 ENCOUNTER — Encounter: Payer: Self-pay | Admitting: Surgery

## 2017-06-21 ENCOUNTER — Other Ambulatory Visit: Payer: Self-pay

## 2017-06-21 ENCOUNTER — Encounter: Payer: Self-pay | Admitting: *Deleted

## 2017-06-29 ENCOUNTER — Ambulatory Visit: Payer: 59 | Admitting: Anesthesiology

## 2017-06-29 ENCOUNTER — Ambulatory Visit
Admission: RE | Admit: 2017-06-29 | Discharge: 2017-06-29 | Disposition: A | Discharge status: Home / Self Care | Payer: 59 | Source: Ambulatory Visit | Attending: Surgery | Admitting: Surgery

## 2017-06-29 ENCOUNTER — Encounter
Admission: RE | Disposition: A | Discharge status: Home / Self Care | Payer: Self-pay | Source: Ambulatory Visit | Attending: Surgery

## 2017-06-29 DIAGNOSIS — G5603 Carpal tunnel syndrome, bilateral upper limbs: Secondary | ICD-10-CM | POA: Insufficient documentation

## 2017-06-29 DIAGNOSIS — F17200 Nicotine dependence, unspecified, uncomplicated: Secondary | ICD-10-CM | POA: Insufficient documentation

## 2017-06-29 DIAGNOSIS — Z79899 Other long term (current) drug therapy: Secondary | ICD-10-CM | POA: Diagnosis not present

## 2017-06-29 DIAGNOSIS — K219 Gastro-esophageal reflux disease without esophagitis: Secondary | ICD-10-CM | POA: Insufficient documentation

## 2017-06-29 DIAGNOSIS — G5602 Carpal tunnel syndrome, left upper limb: Secondary | ICD-10-CM | POA: Diagnosis present

## 2017-06-29 DIAGNOSIS — G5622 Lesion of ulnar nerve, left upper limb: Secondary | ICD-10-CM | POA: Diagnosis not present

## 2017-06-29 DIAGNOSIS — E7800 Pure hypercholesterolemia, unspecified: Secondary | ICD-10-CM | POA: Insufficient documentation

## 2017-06-29 HISTORY — PX: CARPAL TUNNEL RELEASE: SHX101

## 2017-06-29 SURGERY — RELEASE, CARPAL TUNNEL, ENDOSCOPIC
Anesthesia: General | Site: Arm Lower | Laterality: Left | Wound class: Clean

## 2017-06-29 MED ORDER — OXYCODONE HCL 5 MG/5ML PO SOLN: 5.0000 mg | Freq: Once | ORAL | Status: DC | PRN

## 2017-06-29 MED ORDER — OXYCODONE HCL 5 MG PO TABS: 5.0000 mg | ORAL_TABLET | Freq: Once | ORAL | Status: DC | PRN

## 2017-06-29 MED ORDER — FENTANYL CITRATE (PF) 100 MCG/2ML IJ SOLN: 25.0000 ug | INTRAMUSCULAR | Status: DC | PRN

## 2017-06-29 MED ORDER — ACETAMINOPHEN 325 MG PO TABS: 325.0000 mg | ORAL_TABLET | ORAL | Status: DC | PRN

## 2017-06-29 MED ORDER — LACTATED RINGERS IV SOLN
INTRAVENOUS | Status: DC
  Administered 2017-06-29: 14:00:00 via INTRAVENOUS

## 2017-06-29 MED ORDER — HYDROCODONE-ACETAMINOPHEN 7.5-325 MG PO TABS: 1.0000 | ORAL_TABLET | Freq: Four times a day (QID) | ORAL | 0 refills | Status: AC | PRN

## 2017-06-29 MED ORDER — LACTATED RINGERS IV SOLN
10.0000 mL/h | INTRAVENOUS | Status: DC
  Administered 2017-06-29: 10 mL/h via INTRAVENOUS

## 2017-06-29 MED ORDER — ONDANSETRON HCL 4 MG/2ML IJ SOLN: 4.0000 mg | Freq: Once | INTRAMUSCULAR | Status: DC | PRN

## 2017-06-29 MED ORDER — DEXAMETHASONE SODIUM PHOSPHATE 4 MG/ML IJ SOLN
INTRAMUSCULAR | Status: DC | PRN
  Administered 2017-06-29: 4 mg via PERINEURAL

## 2017-06-29 MED ORDER — MIDAZOLAM HCL 2 MG/2ML IJ SOLN
INTRAMUSCULAR | Status: DC | PRN
  Administered 2017-06-29: 1 mg via INTRAVENOUS

## 2017-06-29 MED ORDER — FENTANYL CITRATE (PF) 100 MCG/2ML IJ SOLN
INTRAMUSCULAR | Status: DC | PRN
  Administered 2017-06-29: 50 ug via INTRAVENOUS

## 2017-06-29 MED ORDER — ACETAMINOPHEN 160 MG/5ML PO SOLN: 325.0000 mg | ORAL | Status: DC | PRN

## 2017-06-29 MED ORDER — CEFAZOLIN SODIUM-DEXTROSE 2-4 GM/100ML-% IV SOLN
2.0000 g | Freq: Once | INTRAVENOUS | Status: AC
  Administered 2017-06-29: 2 g via INTRAVENOUS

## 2017-06-29 MED ORDER — BUPIVACAINE HCL (PF) 0.5 % IJ SOLN
INTRAMUSCULAR | Status: DC | PRN
  Administered 2017-06-29 (×2): 10 mL

## 2017-06-29 MED ORDER — ROPIVACAINE HCL 5 MG/ML IJ SOLN
INTRAMUSCULAR | Status: DC | PRN
  Administered 2017-06-29: 30 mL via PERINEURAL

## 2017-06-29 MED ORDER — PROPOFOL 500 MG/50ML IV EMUL
INTRAVENOUS | Status: DC | PRN
  Administered 2017-06-29: 75 ug/kg/min via INTRAVENOUS

## 2017-06-29 SURGICAL SUPPLY — 29 items
BANDAGE ELASTIC 2 LF NS (GAUZE/BANDAGES/DRESSINGS) ×2 IMPLANT
BANDAGE ELASTIC 3 LF NS (GAUZE/BANDAGES/DRESSINGS) ×2 IMPLANT
BNDG COHESIVE 4X5 TAN STRL (GAUZE/BANDAGES/DRESSINGS) ×2 IMPLANT
BNDG ESMARK 4X12 TAN STRL LF (GAUZE/BANDAGES/DRESSINGS) ×2 IMPLANT
CHLORAPREP W/TINT 26ML (MISCELLANEOUS) ×2 IMPLANT
CORD BIP STRL DISP 12FT (MISCELLANEOUS) ×2 IMPLANT
COVER LIGHT HANDLE UNIVERSAL (MISCELLANEOUS) ×4 IMPLANT
CUFF TOURN SGL QUICK 18 (TOURNIQUET CUFF) ×2 IMPLANT
DRAPE SURG 17X11 SM STRL (DRAPES) ×2 IMPLANT
GAUZE PETRO XEROFOAM 1X8 (MISCELLANEOUS) ×2 IMPLANT
GAUZE SPONGE 4X4 12PLY STRL (GAUZE/BANDAGES/DRESSINGS) ×2 IMPLANT
GLOVE BIO SURGEON STRL SZ8 (GLOVE) ×4 IMPLANT
GLOVE INDICATOR 8.0 STRL GRN (GLOVE) ×4 IMPLANT
GOWN STRL REUS W/ TWL LRG LVL3 (GOWN DISPOSABLE) ×1 IMPLANT
GOWN STRL REUS W/ TWL XL LVL3 (GOWN DISPOSABLE) ×1 IMPLANT
GOWN STRL REUS W/TWL LRG LVL3 (GOWN DISPOSABLE) ×1
GOWN STRL REUS W/TWL XL LVL3 (GOWN DISPOSABLE) ×1
KIT CARPAL TUNNEL (MISCELLANEOUS) ×1
KIT ESCP INSRT D SLOT CANN KN (MISCELLANEOUS) ×1 IMPLANT
KIT ROOM TURNOVER OR (KITS) ×2 IMPLANT
NS IRRIG 500ML POUR BTL (IV SOLUTION) ×2 IMPLANT
PACK EXTREMITY ARMC (MISCELLANEOUS) ×2 IMPLANT
SLING ARM S TX990203 (SOFTGOODS) ×2 IMPLANT
SPLINT WRIST M LT TX990308 (SOFTGOODS) ×2 IMPLANT
STOCKINETTE IMPERVIOUS 9X36 MD (GAUZE/BANDAGES/DRESSINGS) ×2 IMPLANT
STRAP BODY AND KNEE 60X3 (MISCELLANEOUS) ×2 IMPLANT
SUT PROLENE 4 0 PS 2 18 (SUTURE) ×2 IMPLANT
SUT VIC AB 3-0 SH 27 (SUTURE) ×1
SUT VIC AB 3-0 SH 27X BRD (SUTURE) ×1 IMPLANT

## 2017-06-29 NOTE — Anesthesia Postprocedure Evaluation (Signed)
Anesthesia Post Note  Patient: Karen Flynn  Procedure(s) Performed: CARPAL TUNNEL RELEASE ENDOSCOPICAND SUBCUTANEOUS ANTERIOR TRANSPOSITION OF THE LEFT ULNAR NERVE AT THE ELBOW (Left Arm Lower)  Patient location during evaluation: PACU Anesthesia Type: General Level of consciousness: awake and alert and oriented Pain management: satisfactory to patient Vital Signs Assessment: post-procedure vital signs reviewed and stable Respiratory status: spontaneous breathing, nonlabored ventilation and respiratory function stable Cardiovascular status: blood pressure returned to baseline and stable Postop Assessment: Adequate PO intake and No signs of nausea or vomiting Anesthetic complications: no    Karen Flynn, Karen Flynn

## 2017-06-29 NOTE — Anesthesia Procedure Notes (Signed)
Performed by: Brinnley Lacap, CRNA Pre-anesthesia Checklist: Patient identified, Emergency Drugs available, Suction available, Timeout performed and Patient being monitored Patient Re-evaluated:Patient Re-evaluated prior to induction Oxygen Delivery Method: Simple face mask Placement Confirmation: positive ETCO2       

## 2017-06-29 NOTE — Progress Notes (Signed)
Assisted Mike Stella ANMD with left, ultrasound guided, supraclavicular block. Side rails up, monitors on throughout procedure. See vital signs in flow sheet. Tolerated Procedure well.  

## 2017-06-29 NOTE — Anesthesia Preprocedure Evaluation (Signed)
Anesthesia Evaluation  Patient identified by MRN, date of birth, ID band Patient awake    Reviewed: Allergy & Precautions, H&P , NPO status , Patient's Chart, lab work & pertinent test results, reviewed documented beta blocker date and time   Airway Mallampati: II  TM Distance: >3 FB Neck ROM: full    Dental no notable dental hx.    Pulmonary Current Smoker,    Pulmonary exam normal breath sounds clear to auscultation       Cardiovascular Exercise Tolerance: Good negative cardio ROS Normal cardiovascular exam Rhythm:regular Rate:Normal     Neuro/Psych  Headaches, negative neurological ROS  negative psych ROS   GI/Hepatic Neg liver ROS, GERD  Medicated and Controlled,  Endo/Other  negative endocrine ROS  Renal/GU negative Renal ROS  negative genitourinary   Musculoskeletal   Abdominal   Peds  Hematology negative hematology ROS (+)   Anesthesia Other Findings   Reproductive/Obstetrics negative OB ROS                             Anesthesia Physical  Anesthesia Plan  ASA: II  Anesthesia Plan: General   Post-op Pain Management:  Regional for Post-op pain   Induction:   PONV Risk Score and Plan: 3 and Midazolam, Ondansetron and Dexamethasone  Airway Management Planned:   Additional Equipment:   Intra-op Plan:   Post-operative Plan:   Informed Consent: I have reviewed the patients History and Physical, chart, labs and discussed the procedure including the risks, benefits and alternatives for the proposed anesthesia with the patient or authorized representative who has indicated his/her understanding and acceptance.     Plan Discussed with: CRNA  Anesthesia Plan Comments:         Anesthesia Quick Evaluation

## 2017-06-29 NOTE — H&P (Signed)
Paper H&P to be scanned into permanent record. H&P reviewed and patient re-examined. No changes. 

## 2017-06-29 NOTE — Transfer of Care (Signed)
Immediate Anesthesia Transfer of Care Note  Patient: Karen ForthJanet L Flynn  Procedure(s) Performed: CARPAL TUNNEL RELEASE ENDOSCOPICAND SUBCUTANEOUS ANTERIOR TRANSPOSITION OF THE LEFT ULNAR NERVE AT THE ELBOW (Left Arm Lower)  Patient Location: PACU  Anesthesia Type: General  Level of Consciousness: awake, alert  and patient cooperative  Airway and Oxygen Therapy: Patient Spontanous Breathing and Patient connected to supplemental oxygen  Post-op Assessment: Post-op Vital signs reviewed, Patient's Cardiovascular Status Stable, Respiratory Function Stable, Patent Airway and No signs of Nausea or vomiting  Post-op Vital Signs: Reviewed and stable  Complications: No apparent anesthesia complications

## 2017-06-29 NOTE — Op Note (Signed)
06/29/2017  3:36 PM  Patient:   Karen ForthJanet L Flynn  Pre-Op Diagnosis:   1. Left carpal tunnel syndrome.  2. Left cubital tunnel syndrome.  Post-Op Diagnosis:   Same.  Procedure:   1. Endoscopic left carpal tunnel release.  2. Decompression of ulnar nerve at left elbow.  Surgeon:   Maryagnes AmosJ. Jeffrey Poggi, MD  Anesthesia:   IV sedation with interscalene block  Findings:   As above.  Complications:   None  EBL:   1 cc  Fluids:   500 cc crystalloid  TT:   50 minutes at 250 mmHg  Drains:   None  Closure:   4-0 Prolene interrupted sutures  Brief Clinical Note:   The patient is a 66 year old female with a history of pain and paresthesias involving the entire left hand. Her symptoms have persisted despite medications, activity modification, splinting, etc. Her history and examination are consistent with carpal tunnel syndrome. An EMG confirmed the presence of left carpal tunnel syndrome but also demonstrated a concomitant left cubital tunnel syndrome. The patient presents at this time for an endoscopic left carpal tunnel release and decompression of the ulnar nerve at the left elbow.   Procedure:   The patient underwent placement of an interscalene block by the anesthesiologist in the preoperative holding area before she was brought into the operating room and lain in the supine position. After adequate IV sedation was achieved, left hand and upper extremity were prepped with ChloraPrep solution before being draped sterilely. Preoperative antibiotics were administered. A timeout was performed to verify the appropriate surgical site before the limb was exsanguinated with an Esmarch and a sterile tourniquet inflated to 250 mmHg. An approximately 1.5-2 cm incision was made over the volar wrist flexion crease, centered over the palmaris longus tendon. The incision was carried down through the subcutaneous tissues with care taken to identify and protect any neurovascular structures. The distal forearm fascia  was penetrated just proximal to the transverse carpal ligament. The soft tissues were released off the superficial and deep surfaces of the distal forearm fascia and this was released proximally for 3-4 cm under direct visualization.  Attention was directed distally. The Therapist, nutritionalreer elevator was passed beneath the transverse carpal ligament along the ulnar aspect of the carpal tunnel and used to release any adhesions as well as to remove any adherent synovial tissue before first the smaller then the larger of the two dilators were passed beneath the transverse carpal ligament along the ulnar margin of the carpal tunnel. The slotted cannula was introduced and the endoscope was placed into the slotted cannula and the undersurface of the transverse carpal ligament visualized. The distal margin of the transverse carpal ligament was marked by placing a 25-gauge needle percutaneously at Kaplan's cardinal point so that it entered the distal portion of the slotted cannula. Under endoscopic visualization, the transverse carpal ligament was released from proximal to distal using the end-cutting blade. A second pass was performed to ensure complete release of the ligament. The adequacy of release was verified both endoscopically and by palpation using the freer elevator.  The wound was irrigated thoroughly with sterile saline solution before being closed using 4-0 Prolene interrupted sutures. A total of 10 cc of 0.5% plain Sensorcaine was injected in and around the incision to help with postoperative analgesia.  Attention was directed to the left elbow. An approximately 7-8 cm curvilinear incision was made along the course of the ulnar nerve posterior to the medial epicondyle. The incision was carried down through  the subcutaneous tissues with care taken to avoid the small branches of the medial antebrachial nerve to expose the sheath overlying the cubital tunnel. The ulnar nerve was identified at the proximal end of the  tunnel and was dissected free. The nerve was then carefully followed as the roof of the cubital tunnel was released from proximal to distal. Distally, the fascia overlying the pronator muscle was released for several centimeters. The nerve was clearly thickened just proximal to the fascia overlying the cubital tunnel, indicating the most likely source of the nerve compression. A vessel loop was passed around the nerve and used to provide gentle traction on the nerve while circumferential dissection was carried out under loupe magnification using bipolar electrocautery and tenotomy scissors.  Given the size of the nerve and the relative tension it was placed under to try to move the nerve anteriorly over the medial epicondyle, it was elected to leave the nerve in situ.  The wound was copiously irrigated with sterile saline solution before the subcutaneous tissues were closed using 3-0 Vicryl interrupted sutures. Several additional 3-0 Vicryl subcuticular sutures were placed before the skin was reapproximated using benzoin and Steri-Strips. A total of 10 cc of 0.5% plain Sensorcaine was injected in and around the incision to help with postoperative analgesia. Sterile bulky dressings were applied to both surgical sites before the patient was placed into a Velcro volar wrist splint and a sling. The patient was then awakened and returned to the recovery room in satisfactory condition after tolerating the procedure well.

## 2017-06-29 NOTE — Anesthesia Procedure Notes (Signed)
Anesthesia Regional Block: Supraclavicular block   Pre-Anesthetic Checklist: ,, timeout performed, Correct Patient, Correct Site, Correct Laterality, Correct Procedure, Correct Position, site marked, Risks and benefits discussed,  Surgical consent,  Pre-op evaluation,  At surgeon's request and post-op pain management  Laterality: Left  Prep: chloraprep       Needles:  Injection technique: Single-shot  Needle Type: Echogenic Stimulator Needle      Needle Gauge: 21     Additional Needles:   Procedures:,,,, ultrasound used (permanent image in chart),,,,  Narrative:  Start time: 06/29/2017 1:41 PM End time: 06/29/2017 1:50 PM Injection made incrementally with aspirations every 5 mL.  Performed by: Personally  Anesthesiologist: Ranee GosselinStella, Budd Freiermuth, MD  Additional Notes: Functioning IV was confirmed and monitors applied.  Sterile prep and drape,hand hygiene and sterile gloves were used.Ultrasound guidance: relevant anatomy identified, needle position confirmed, local anesthetic spread visualized around nerve(s)., vascular puncture avoided.  Image printed for medical record.  Negative aspiration and negative test dose prior to incremental administration of local anesthetic. The patient tolerated the procedure well. Vitals signes recorded in RN notes.

## 2017-06-29 NOTE — Discharge Instructions (Signed)
General Anesthesia, Adult, Care After °These instructions provide you with information about caring for yourself after your procedure. Your health care provider may also give you more specific instructions. Your treatment has been planned according to current medical practices, but problems sometimes occur. Call your health care provider if you have any problems or questions after your procedure. °What can I expect after the procedure? °After the procedure, it is common to have: °· Vomiting. °· A sore throat. °· Mental slowness. ° °It is common to feel: °· Nauseous. °· Cold or shivery. °· Sleepy. °· Tired. °· Sore or achy, even in parts of your body where you did not have surgery. ° °Follow these instructions at home: °For at least 24 hours after the procedure: °· Do not: °? Participate in activities where you could fall or become injured. °? Drive. °? Use heavy machinery. °? Drink alcohol. °? Take sleeping pills or medicines that cause drowsiness. °? Make important decisions or sign legal documents. °? Take care of children on your own. °· Rest. °Eating and drinking °· If you vomit, drink water, juice, or soup when you can drink without vomiting. °· Drink enough fluid to keep your urine clear or pale yellow. °· Make sure you have little or no nausea before eating solid foods. °· Follow the diet recommended by your health care provider. °General instructions °· Have a responsible adult stay with you until you are awake and alert. °· Return to your normal activities as told by your health care provider. Ask your health care provider what activities are safe for you. °· Take over-the-counter and prescription medicines only as told by your health care provider. °· If you smoke, do not smoke without supervision. °· Keep all follow-up visits as told by your health care provider. This is important. °Contact a health care provider if: °· You continue to have nausea or vomiting at home, and medicines are not helpful. °· You  cannot drink fluids or start eating again. °· You cannot urinate after 8-12 hours. °· You develop a skin rash. °· You have fever. °· You have increasing redness at the site of your procedure. °Get help right away if: °· You have difficulty breathing. °· You have chest pain. °· You have unexpected bleeding. °· You feel that you are having a life-threatening or urgent problem. °This information is not intended to replace advice given to you by your health care provider. Make sure you discuss any questions you have with your health care provider. °Document Released: 09/27/2000 Document Revised: 11/24/2015 Document Reviewed: 06/05/2015 °[employer] Interactive Patient Education © 2018 [employer] ° ° °Keep dressing dry and intact. °Keep hand elevated above heart level. °May shower after dressing removed on postop day 4 (Sunday). °Cover sutures with Band-Aids after drying off. °Apply ice to affected area frequently. °Take ibuprofen 600 mg TID with meals for 7-10 days, then as necessary. °Take ES Tylenol or pain medication as prescribed when needed.  °Return for follow-up in 10-14 days or as scheduled. °

## 2017-06-30 ENCOUNTER — Encounter: Payer: Self-pay | Admitting: Surgery

## 2017-07-21 ENCOUNTER — Other Ambulatory Visit: Payer: Self-pay | Admitting: Internal Medicine

## 2017-07-21 DIAGNOSIS — Z1231 Encounter for screening mammogram for malignant neoplasm of breast: Secondary | ICD-10-CM

## 2017-07-27 ENCOUNTER — Ambulatory Visit: Payer: 59

## 2017-08-08 ENCOUNTER — Ambulatory Visit
Admission: RE | Admit: 2017-08-08 | Discharge: 2017-08-08 | Disposition: A | Discharge status: Home / Self Care | Payer: Medicare Other | Source: Ambulatory Visit | Attending: Internal Medicine | Admitting: Internal Medicine

## 2017-08-08 DIAGNOSIS — Z1231 Encounter for screening mammogram for malignant neoplasm of breast: Secondary | ICD-10-CM | POA: Diagnosis present

## 2017-08-12 ENCOUNTER — Other Ambulatory Visit: Payer: Self-pay | Admitting: *Deleted

## 2017-08-12 ENCOUNTER — Inpatient Hospital Stay
Admission: RE | Admit: 2017-08-12 | Discharge: 2017-08-12 | Disposition: A | Discharge status: Home / Self Care | Payer: Self-pay | Source: Ambulatory Visit | Attending: *Deleted | Admitting: *Deleted

## 2017-08-12 DIAGNOSIS — Z9289 Personal history of other medical treatment: Secondary | ICD-10-CM

## 2017-09-06 ENCOUNTER — Other Ambulatory Visit: Payer: Self-pay | Admitting: Surgery

## 2017-09-06 DIAGNOSIS — M7581 Other shoulder lesions, right shoulder: Secondary | ICD-10-CM

## 2017-09-14 ENCOUNTER — Ambulatory Visit
Admission: RE | Admit: 2017-09-14 | Discharge: 2017-09-14 | Disposition: A | Discharge status: Home / Self Care | Payer: Medicare Other | Source: Ambulatory Visit | Attending: Surgery | Admitting: Surgery

## 2017-09-14 DIAGNOSIS — M7581 Other shoulder lesions, right shoulder: Secondary | ICD-10-CM | POA: Diagnosis not present

## 2017-09-14 DIAGNOSIS — M19011 Primary osteoarthritis, right shoulder: Secondary | ICD-10-CM | POA: Diagnosis not present

## 2019-07-26 IMAGING — MR MR SHOULDER*R* W/O CM
5 series · 40 of 40 positions shown · non-contrast
Comparison: None.

CLINICAL DATA: Right shoulder and arm pain for 2 months. No known
injury.

EXAM:
MRI OF THE RIGHT SHOULDER WITHOUT CONTRAST
TECHNIQUE: Multiplanar, multisequence MR imaging of the shoulder was performed.
No intravenous contrast was administered.

[Series 3: T2 fat-sat · axial · 4.0mm · 0.47mm/px · z∈[-74,+8]mm · 8 of 21 slices shown (1 of 3)]
[im 1/21]
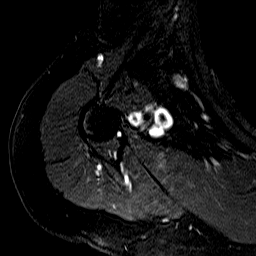
[im 3/21]
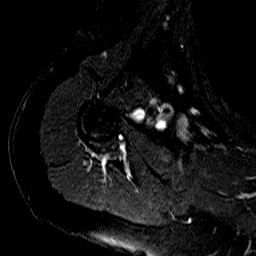
[im 6/21]
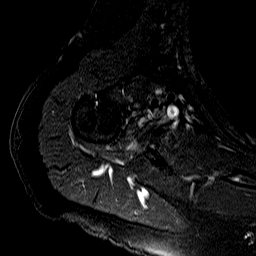
[im 9/21]
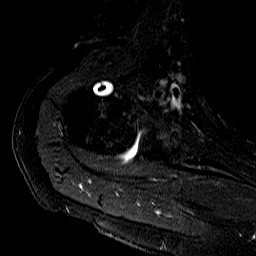
[im 12/21]
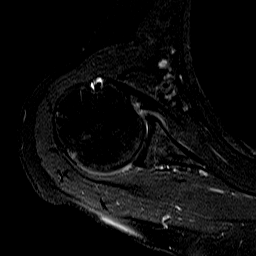
[im 15/21]
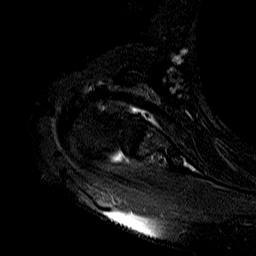
[im 18/21]
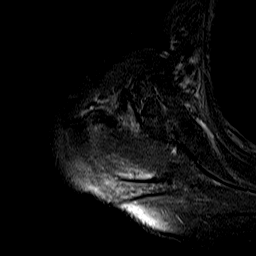
[im 21/21]
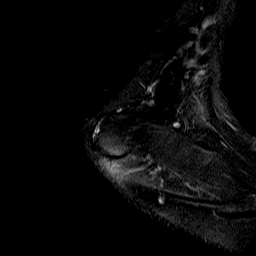

[Series 4: T2 fat-sat · sagittal · 4.0mm · 0.62mm/px · 8 of 19 slices shown (2 of 3)]
[im 1/19]
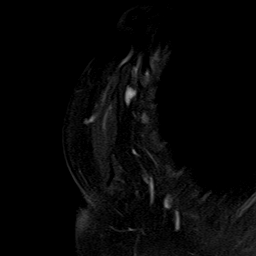
[im 3/19]
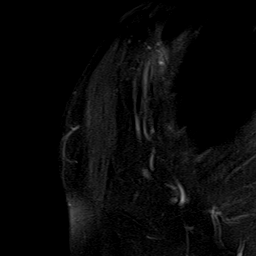
[im 6/19]
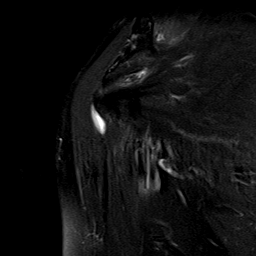
[im 8/19]
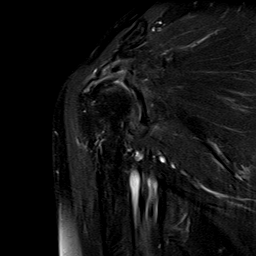
[im 11/19]
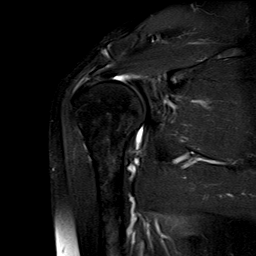
[im 13/19]
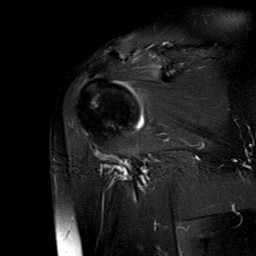
[im 16/19]
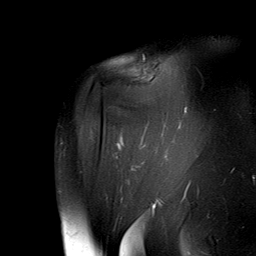
[im 19/19]
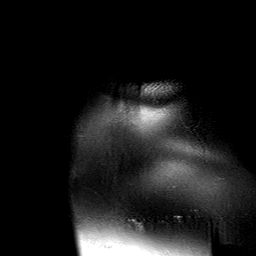

[Series 5: PD · sagittal · 4.0mm · 0.62mm/px · 8 of 19 slices shown]
[im 1/19]
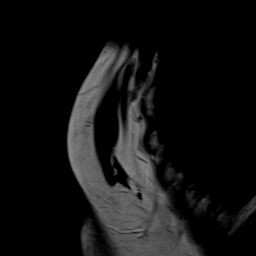
[im 3/19]
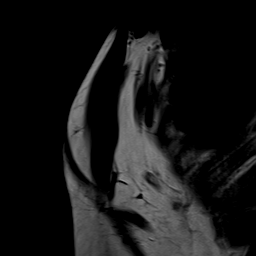
[im 6/19]
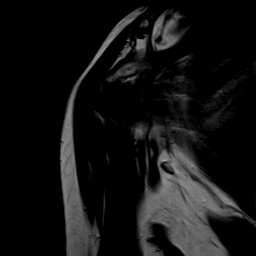
[im 8/19]
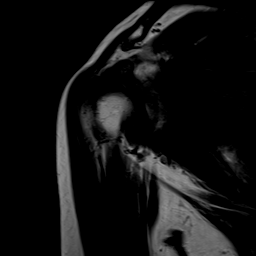
[im 11/19]
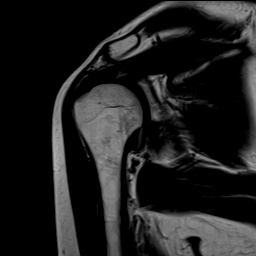
[im 13/19]
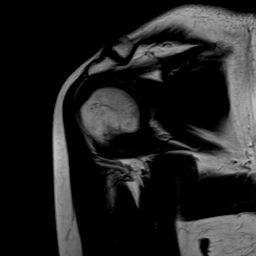
[im 16/19]
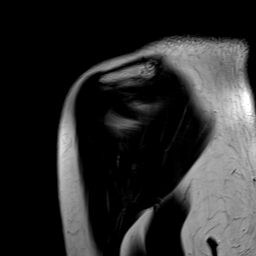
[im 19/19]
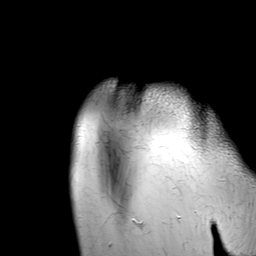

[Series 6: T1 · coronal · 4.0mm · 0.62mm/px · 8 of 19 slices shown]
[im 1/19]
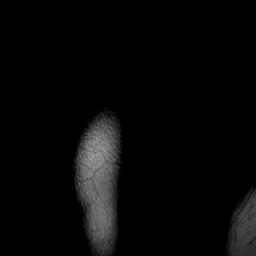
[im 3/19]
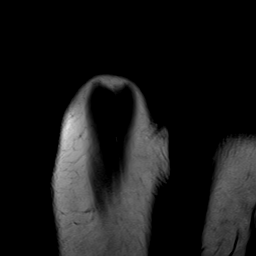
[im 6/19]
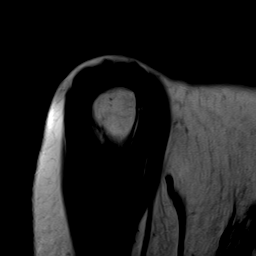
[im 8/19]
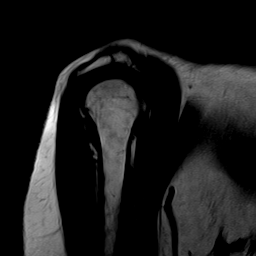
[im 11/19]
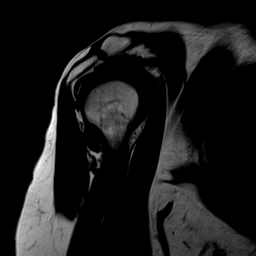
[im 13/19]
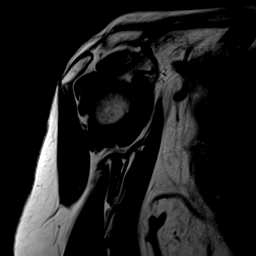
[im 16/19]
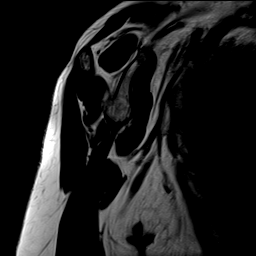
[im 19/19]
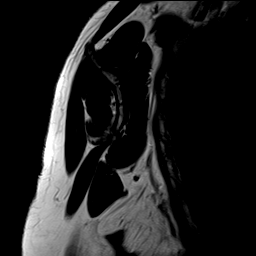

[Series 7: T2 fat-sat · coronal · 4.0mm · 0.62mm/px · 8 of 19 slices shown (3 of 3)]
[im 1/19]
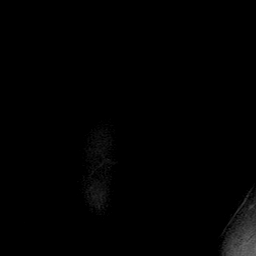
[im 3/19]
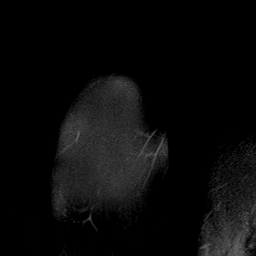
[im 6/19]
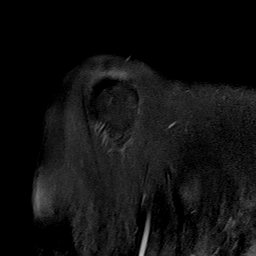
[im 8/19]
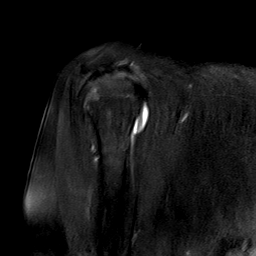
[im 11/19]
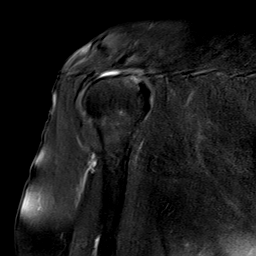
[im 13/19]
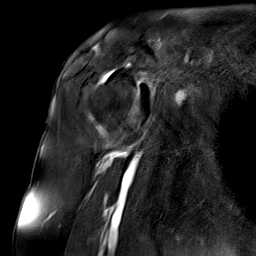
[im 16/19]
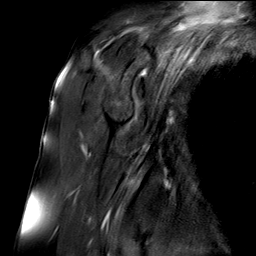
[im 19/19]
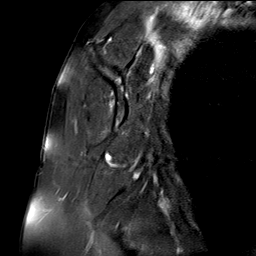

[40 of 40 positions shown; findings below may reference images not displayed]

FINDINGS: Rotator cuff: Heterogeneously increased T2 signal in the
supraspinatus and infraspinatus tendons consistent with tendinopathy
is identified. No tear.

Muscles:  No atrophy or focal lesion.

Biceps long head:  Intact.

Acromioclavicular Joint: Mild degenerative change is seen. Type 1
acromion. The acromion is downsloping. No subacromial/subdeltoid
fluid.

Glenohumeral Joint: Unremarkable.

Labrum:  The superior labrum is degenerated.  No focal tear.

Bones:  No fracture or worrisome lesion.

Other: None.
IMPRESSION: Supraspinatus and infraspinatus tendinopathy without tear.

Mild acromioclavicular osteoarthritis.
